# Patient Record
Sex: Male | Born: 1965 | Race: White | Hispanic: No | Marital: Married
Health system: Southern US, Community
[De-identification: ages and names within clinical notes are randomized; demographics above are authoritative.]

---

## 2022-01-07 ENCOUNTER — Emergency Department: Payer: Managed Care, Other (non HMO)

## 2022-01-07 ENCOUNTER — Encounter: Payer: Self-pay | Admitting: Emergency Medicine

## 2022-01-07 ENCOUNTER — Emergency Department
Admission: EM | Admit: 2022-01-07 | Discharge: 2022-01-07 | Disposition: A | Discharge status: Home / Self Care | Payer: Managed Care, Other (non HMO) | Attending: Emergency Medicine | Admitting: Emergency Medicine

## 2022-01-07 ENCOUNTER — Other Ambulatory Visit: Payer: Self-pay

## 2022-01-07 DIAGNOSIS — Z7984 Long term (current) use of oral hypoglycemic drugs: Secondary | ICD-10-CM | POA: Diagnosis not present

## 2022-01-07 DIAGNOSIS — E1165 Type 2 diabetes mellitus with hyperglycemia: Secondary | ICD-10-CM | POA: Diagnosis not present

## 2022-01-07 DIAGNOSIS — R42 Dizziness and giddiness: Secondary | ICD-10-CM | POA: Diagnosis not present

## 2022-01-07 DIAGNOSIS — R519 Headache, unspecified: Secondary | ICD-10-CM | POA: Diagnosis not present

## 2022-01-07 DIAGNOSIS — H547 Unspecified visual loss: Secondary | ICD-10-CM | POA: Diagnosis present

## 2022-01-07 DIAGNOSIS — H539 Unspecified visual disturbance: Secondary | ICD-10-CM

## 2022-01-07 DIAGNOSIS — H538 Other visual disturbances: Secondary | ICD-10-CM | POA: Diagnosis not present

## 2022-01-07 LAB — CBC
HCT: 50.5 % (ref 39.0–52.0)
Hemoglobin: 17.3 g/dL — ABNORMAL HIGH (ref 13.0–17.0)
MCH: 30.1 pg (ref 26.0–34.0)
MCHC: 34.3 g/dL (ref 30.0–36.0)
MCV: 88 fL (ref 80.0–100.0)
Platelets: 157 10*3/uL (ref 150–400)
RBC: 5.74 MIL/uL (ref 4.22–5.81)
RDW: 12.8 % (ref 11.5–15.5)
WBC: 7.6 10*3/uL (ref 4.0–10.5)
nRBC: 0 % (ref 0.0–0.2)

## 2022-01-07 LAB — URINALYSIS, ROUTINE W REFLEX MICROSCOPIC
Bilirubin Urine: NEGATIVE
Glucose, UA: NEGATIVE mg/dL
Hgb urine dipstick: NEGATIVE
Ketones, ur: NEGATIVE mg/dL
Leukocytes,Ua: NEGATIVE
Nitrite: NEGATIVE
Protein, ur: NEGATIVE mg/dL
Specific Gravity, Urine: 1.017 (ref 1.005–1.030)
pH: 6 (ref 5.0–8.0)

## 2022-01-07 LAB — BASIC METABOLIC PANEL
Anion gap: 11 (ref 5–15)
BUN: 17 mg/dL (ref 6–20)
CO2: 21 mmol/L — ABNORMAL LOW (ref 22–32)
Calcium: 9.3 mg/dL (ref 8.9–10.3)
Chloride: 105 mmol/L (ref 98–111)
Creatinine, Ser: 0.91 mg/dL (ref 0.61–1.24)
GFR, Estimated: >60 mL/min (ref >60)
Glucose, Bld: 160 mg/dL — ABNORMAL HIGH (ref 70–99)
Potassium: 4.5 mmol/L (ref 3.5–5.1)
Sodium: 137 mmol/L (ref 135–145)

## 2022-01-07 LAB — TROPONIN I (HIGH SENSITIVITY)
Troponin I (High Sensitivity): 4 ng/L (ref <18)
Troponin I (High Sensitivity): 3 ng/L (ref <18)

## 2022-01-07 LAB — CBG MONITORING, ED: Glucose-Capillary: 174 mg/dL — ABNORMAL HIGH (ref 70–99)

## 2022-01-07 IMAGING — MR MR MRA NECK WO/W CM
2 of 3 series · 26 of 48 positions shown · IV contrast (10ml Gadavist)
Comparison: None Available.

CLINICAL DATA: Transient ischemic attack, abnormal vision



[Series 12: angio_fl3d_cor_pre_ttc=3.0s · coronal · 0.9mm · 0.85mm/px · 13 of 80 slices shown]
[im 1/80]
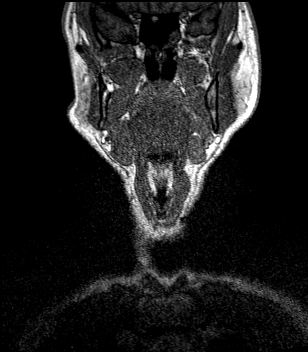
[im 7/80]
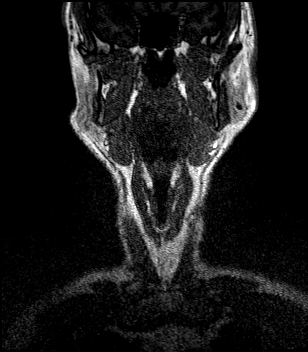
[im 14/80]
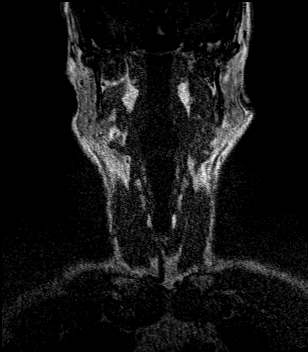
[im 20/80]
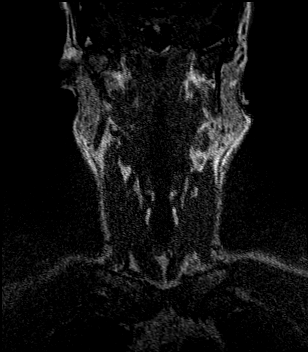
[im 27/80]
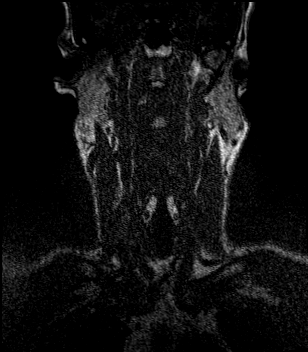
[im 33/80]
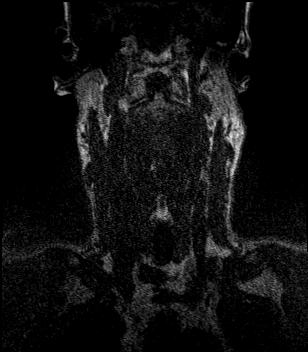
[im 40/80]
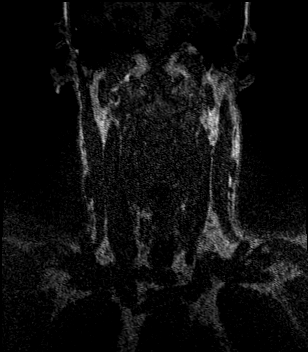
[im 47/80]
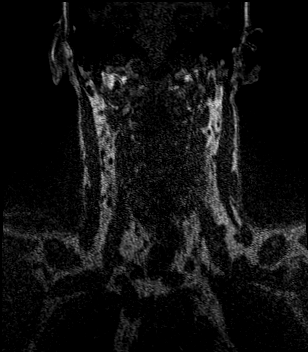
[im 53/80]
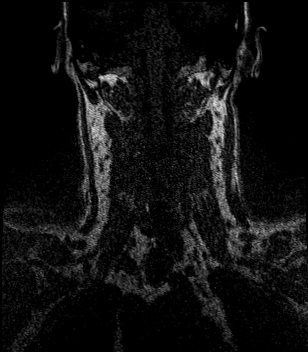
[im 60/80]
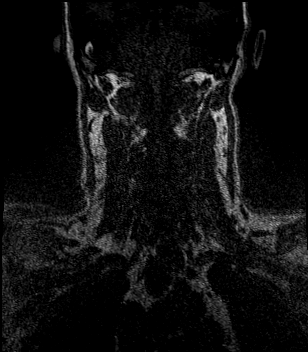
[im 66/80]
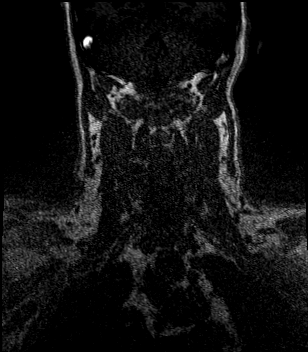
[im 73/80]
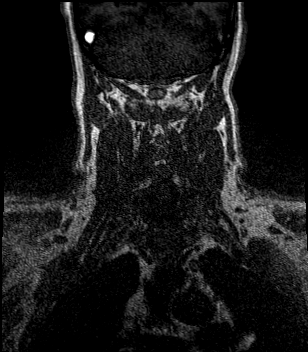
[im 80/80]
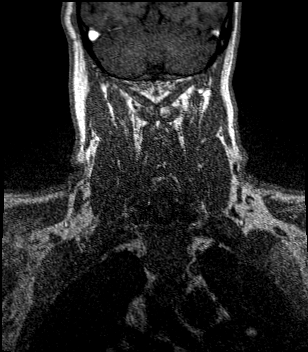

[Series 14: angio_fl3d_cor_post_ttc=3.0s · coronal · 0.9mm · 0.85mm/px · 13 of 80 slices shown]
[im 1/80]
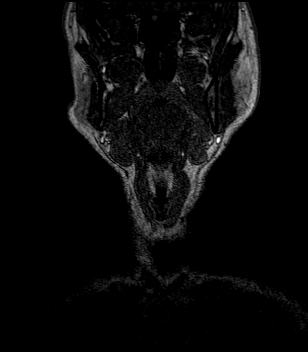
[im 7/80]
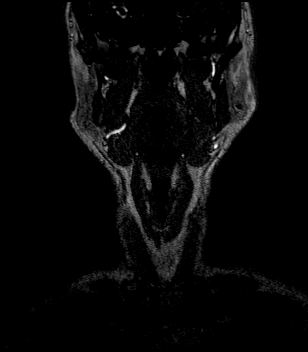
[im 14/80]
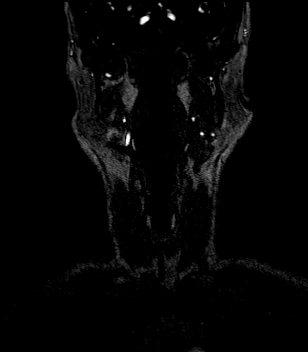
[im 20/80]
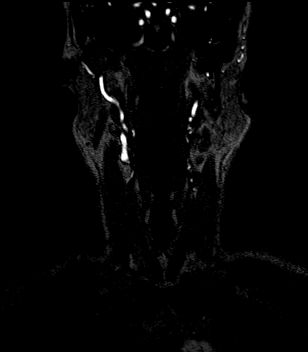
[im 27/80]
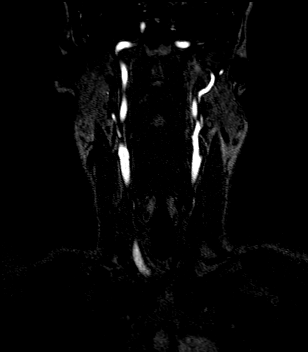
[im 33/80]
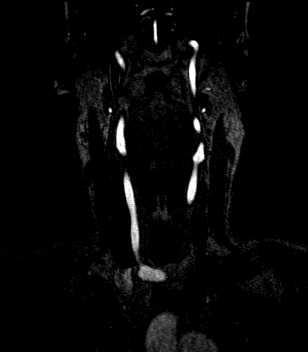
[im 40/80]
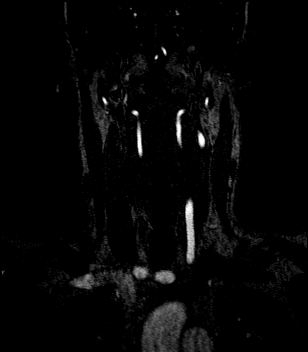
[im 47/80]
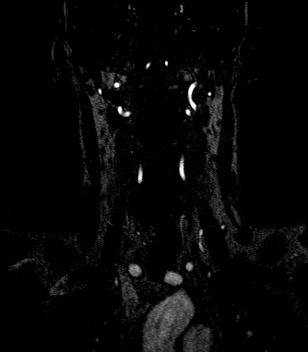
[im 53/80]
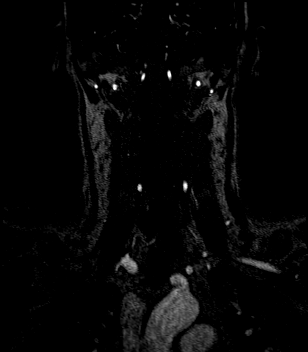
[im 60/80]
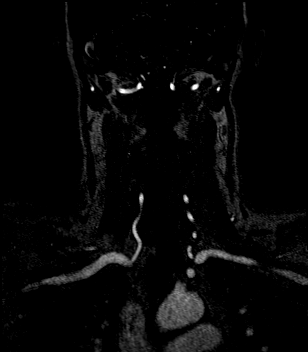
[im 66/80]
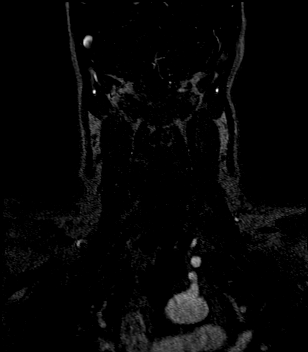
[im 73/80]
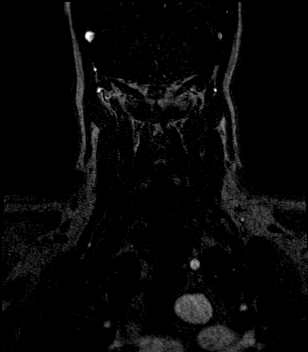
[im 80/80]
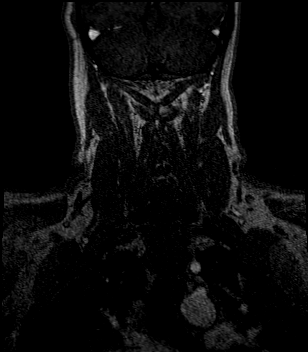

[26 of 48 positions shown; findings below may reference images not displayed]

FINDINGS: MRI HEAD

Brain: There is no acute infarction or intracranial hemorrhage.
There is no intracranial mass, mass effect, or edema. There is no
hydrocephalus or extra-axial fluid collection. Ventricles and sulci
are normal in size and configuration. Patchy foci of T2
hyperintensity in the supratentorial white matter are nonspecific
but may reflect mild chronic microvascular ischemic changes.

Vascular: Major vessel flow voids at the skull base are preserved.

Skull and upper cervical spine: Normal marrow signal is preserved.

Sinuses/Orbits: Small retention cysts or polyps of the sphenoid
sinuses. Orbits are unremarkable.

Other: Sella is unremarkable. Patchy mastoid fluid opacification
bilaterally.

MRA HEAD

Intracranial internal carotid arteries are patent with mild
atherosclerotic irregularity. Middle and anterior cerebral arteries
are patent. Intracranial vertebral arteries, basilar artery,
posterior cerebral arteries are patent. Right posterior
communicating artery is present. There is no significant stenosis or
aneurysm.

MRA NECK

Included arch is unremarkable. Included great vessel origins are
patent. Common, internal, and external carotid arteries are patent.
Mild atherosclerotic irregularity at the ICA origins. Codominant
extracranial vertebral arteries are patent. There is no
hemodynamically significant stenosis or evidence of dissection.
IMPRESSION: No acute infarction, hemorrhage, or mass. Mild chronic microvascular
ischemic changes.

No large vessel occlusion, hemodynamically significant stenosis, or
evidence of dissection.

## 2022-01-07 IMAGING — MR MR MRA HEAD W/O CM
1 series · 26 of 48 positions shown · IV contrast (gadavist)
Comparison: None Available.

CLINICAL DATA: Transient ischemic attack, abnormal vision



[Series 5: TOF · axial · 0.5mm · 0.48mm/px · z∈[-91,+6]mm · 26 of 217 slices shown]
[im 1/217]
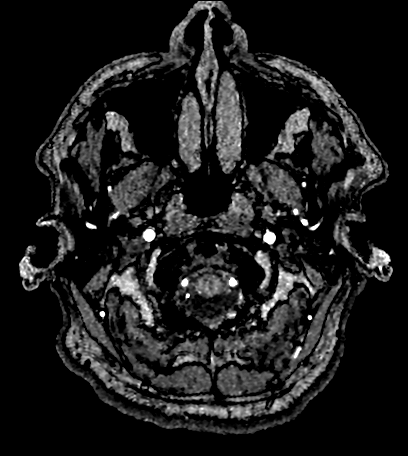
[im 5/217]
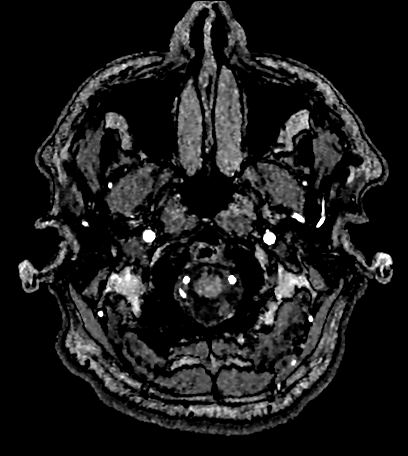
[im 10/217]
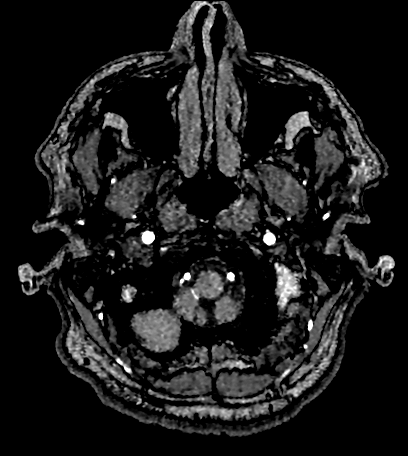
[im 14/217]
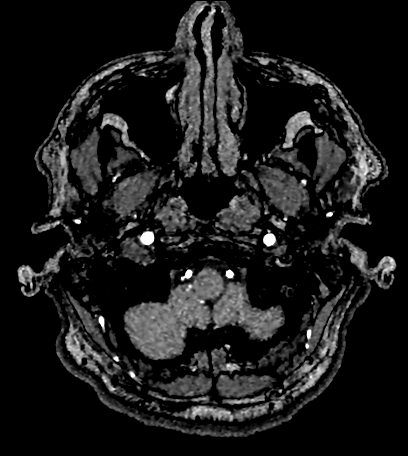
[im 19/217]
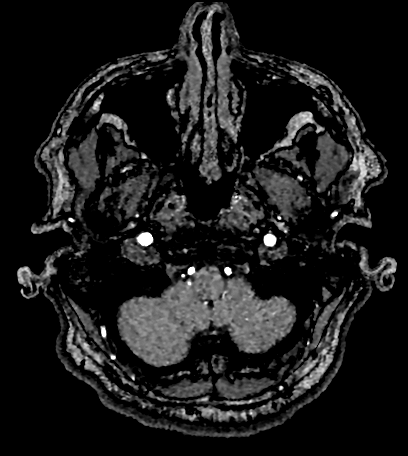
[im 23/217]
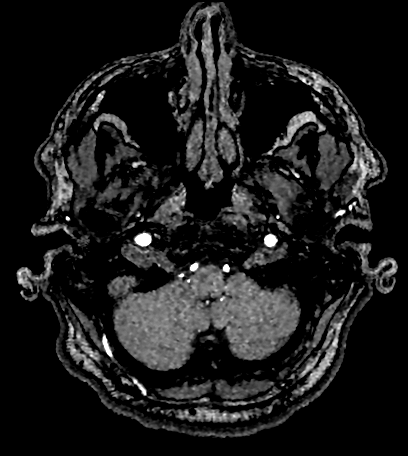
[im 28/217]
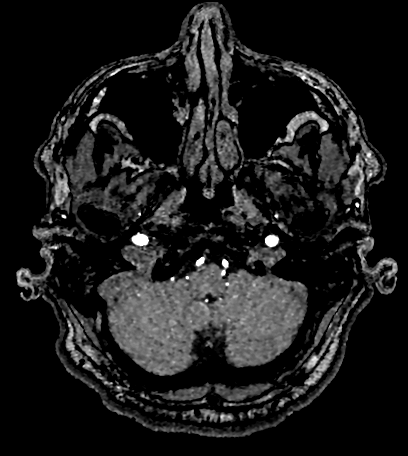
[im 33/217]
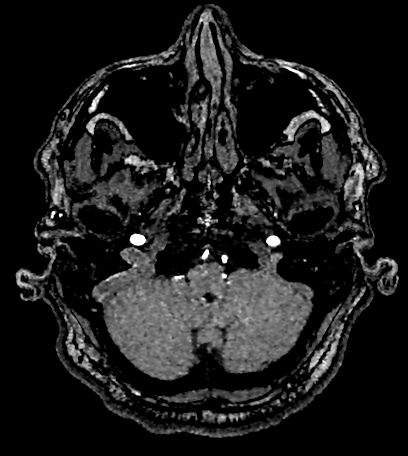
[im 37/217]
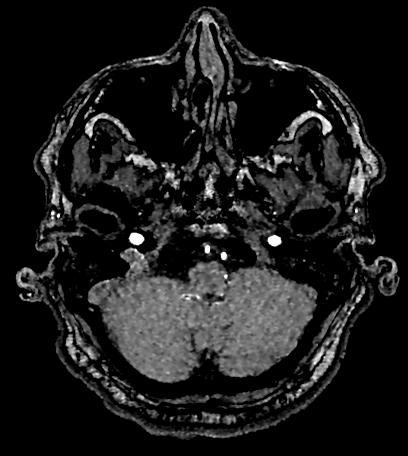
[im 42/217]
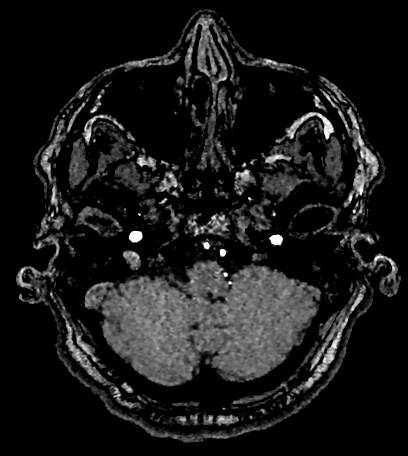
[im 46/217]
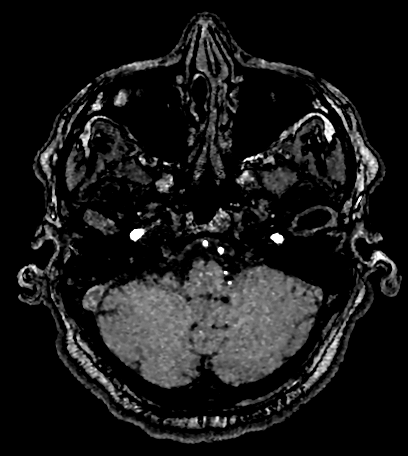
[im 51/217]
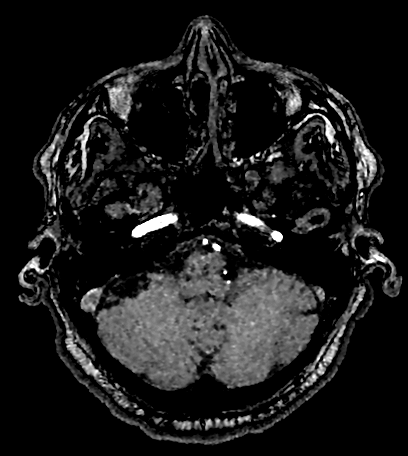
[im 56/217]
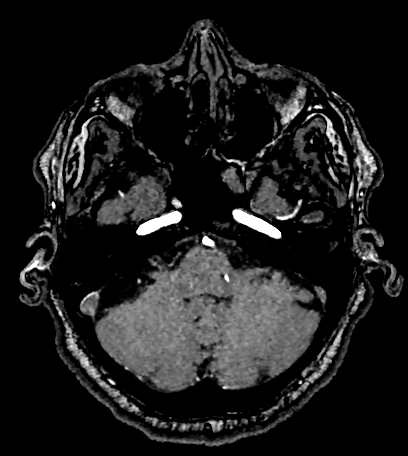
[im 60/217]
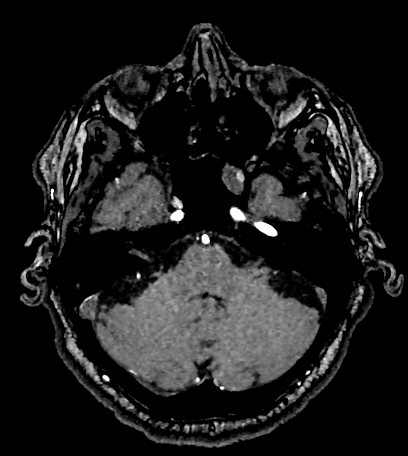
[im 65/217]
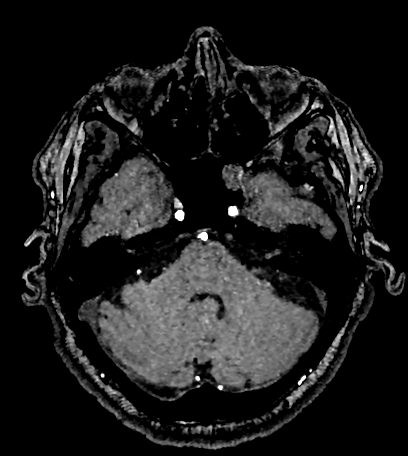
[im 69/217]
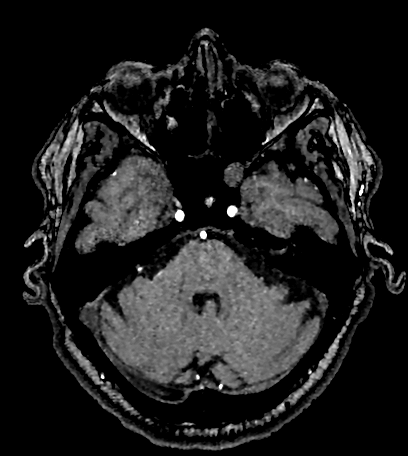
[im 74/217]
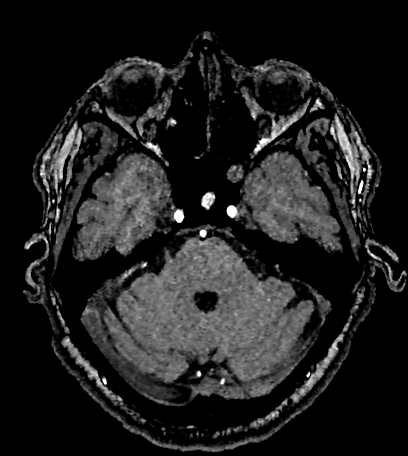
[im 79/217]
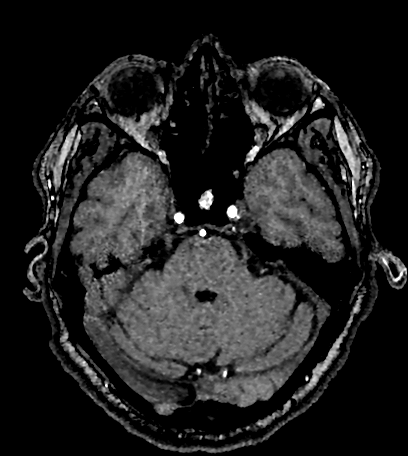
[im 83/217]
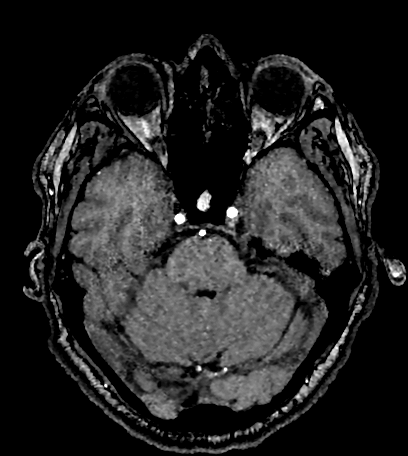
[im 97/217]
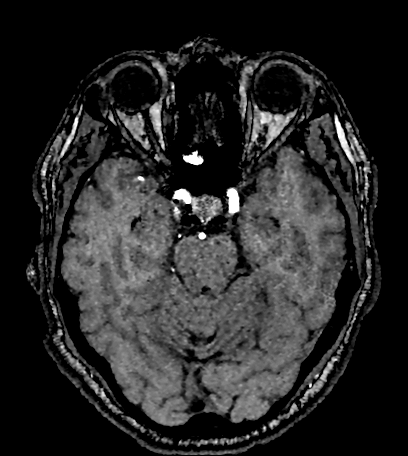
[im 111/217]
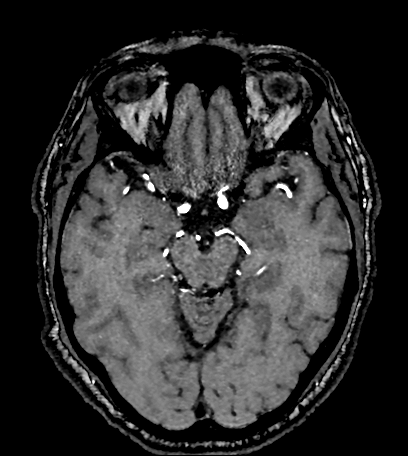
[im 125/217]
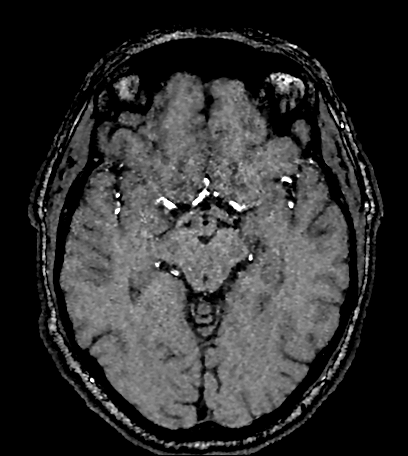
[im 152/217]
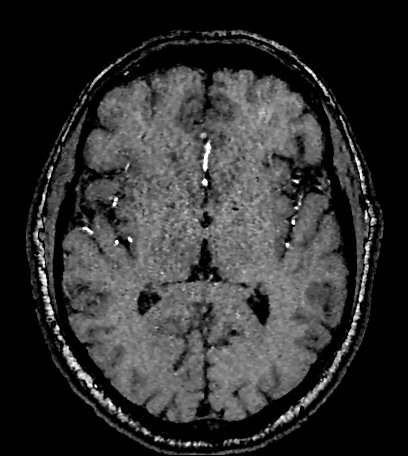
[im 180/217]
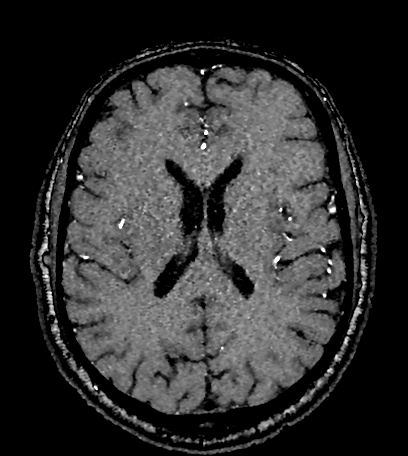
[im 184/217]
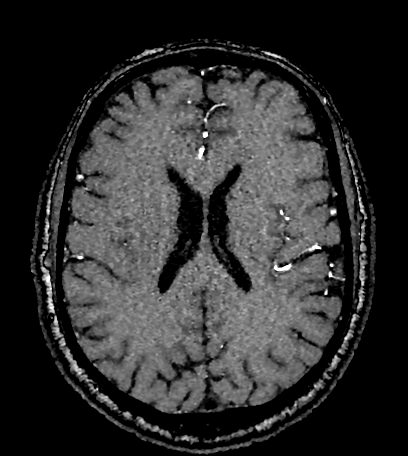
[im 207/217]
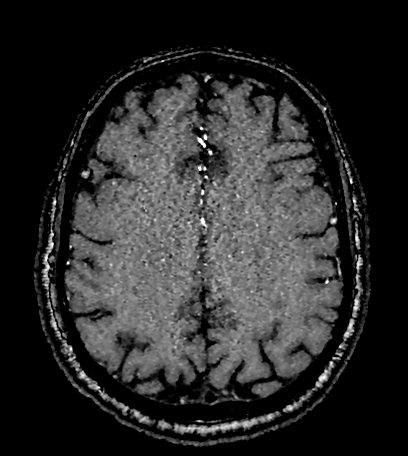

[26 of 48 positions shown; findings below may reference images not displayed]

FINDINGS: MRI HEAD

Brain: There is no acute infarction or intracranial hemorrhage.
There is no intracranial mass, mass effect, or edema. There is no
hydrocephalus or extra-axial fluid collection. Ventricles and sulci
are normal in size and configuration. Patchy foci of T2
hyperintensity in the supratentorial white matter are nonspecific
but may reflect mild chronic microvascular ischemic changes.

Vascular: Major vessel flow voids at the skull base are preserved.

Skull and upper cervical spine: Normal marrow signal is preserved.

Sinuses/Orbits: Small retention cysts or polyps of the sphenoid
sinuses. Orbits are unremarkable.

Other: Sella is unremarkable. Patchy mastoid fluid opacification
bilaterally.

MRA HEAD

Intracranial internal carotid arteries are patent with mild
atherosclerotic irregularity. Middle and anterior cerebral arteries
are patent. Intracranial vertebral arteries, basilar artery,
posterior cerebral arteries are patent. Right posterior
communicating artery is present. There is no significant stenosis or
aneurysm.

MRA NECK

Included arch is unremarkable. Included great vessel origins are
patent. Common, internal, and external carotid arteries are patent.
Mild atherosclerotic irregularity at the ICA origins. Codominant
extracranial vertebral arteries are patent. There is no
hemodynamically significant stenosis or evidence of dissection.
IMPRESSION: No acute infarction, hemorrhage, or mass. Mild chronic microvascular
ischemic changes.

No large vessel occlusion, hemodynamically significant stenosis, or
evidence of dissection.

## 2022-01-07 IMAGING — CT CT HEAD W/O CM
4 series · 17 of 47 positions shown, 19 images · non-contrast
Comparison: None Available.

CLINICAL DATA: Acute vision loss.



[Series 2: head wo · axial · 0.46mm/px · z∈[-82,+48]mm · 7 of 36 slices shown, 9 images]
[im 5/36  brain]
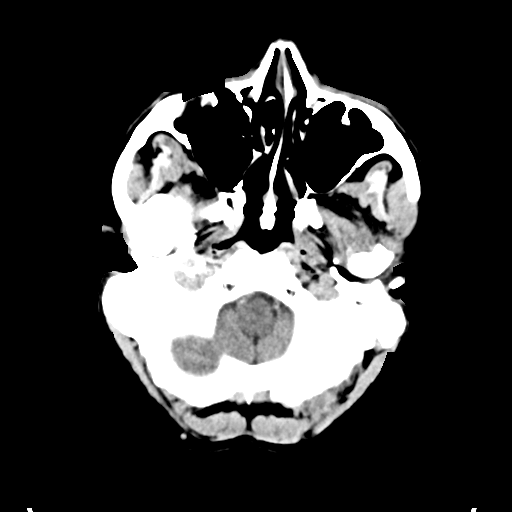
[im 5/36  bone]
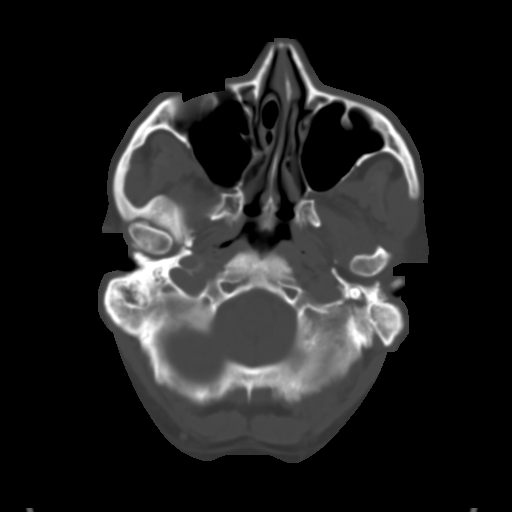
[im 9/36  brain]
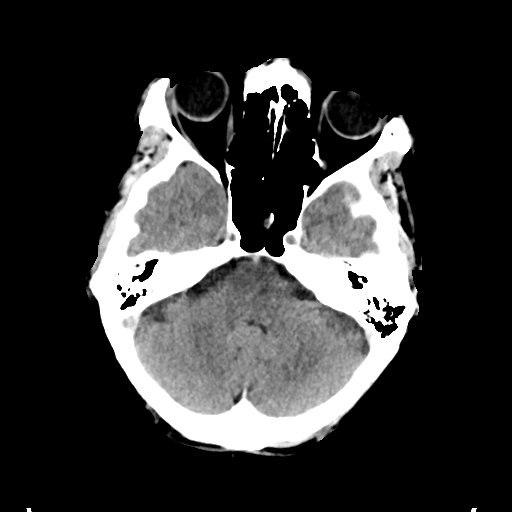
[im 14/36  brain]
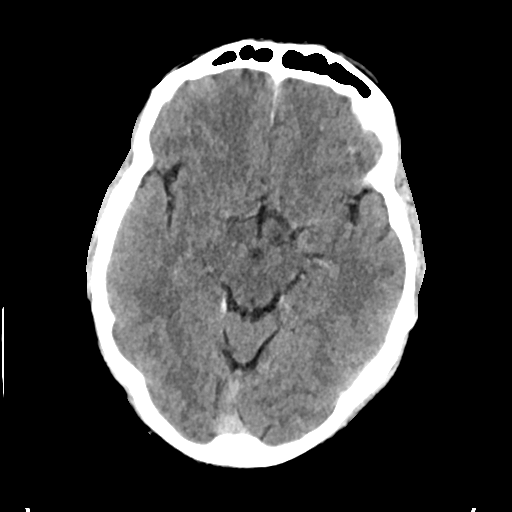
[im 18/36  brain]
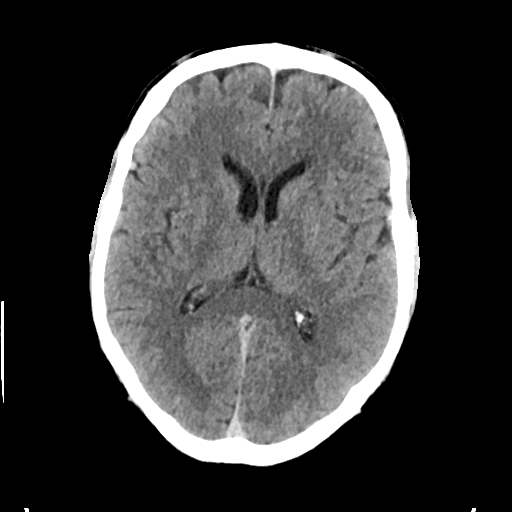
[im 22/36  brain]
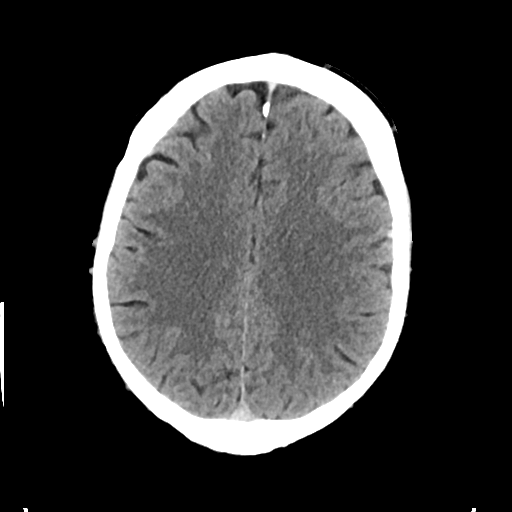
[im 22/36  bone]
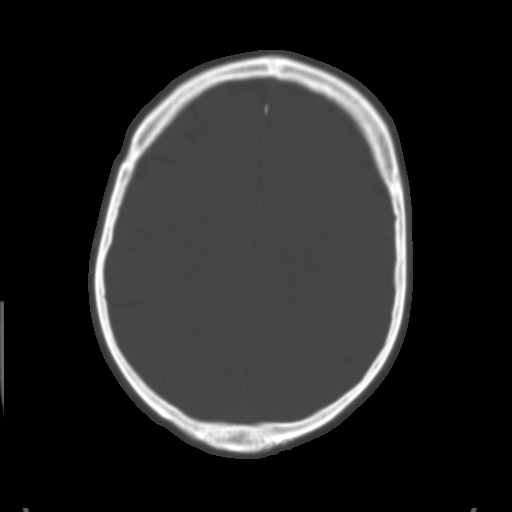
[im 27/36  brain]
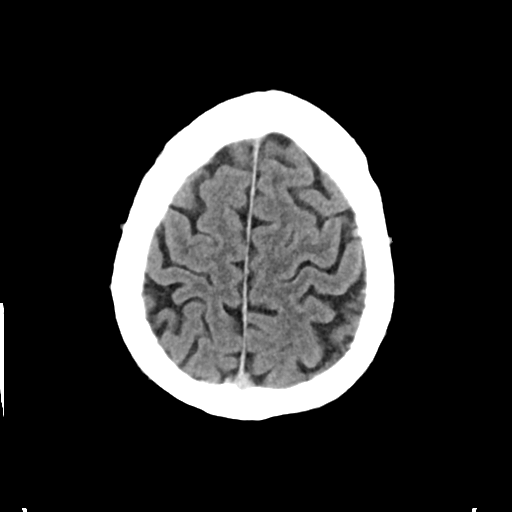
[im 31/36  brain]
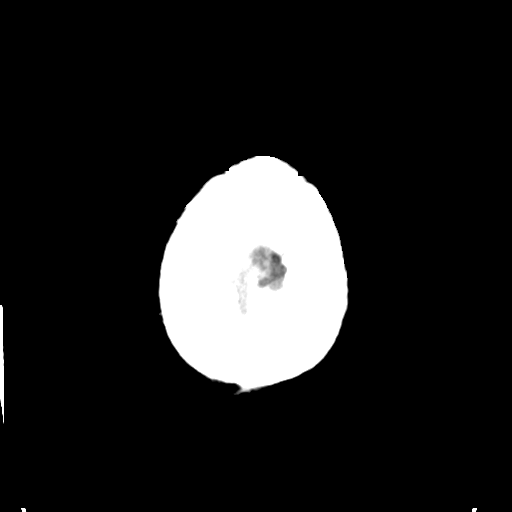

[Series 3: head bone · axial · 0.46mm/px · z∈[-86,-24]mm · 4 of 88 slices shown]
[im 9/88  bone]
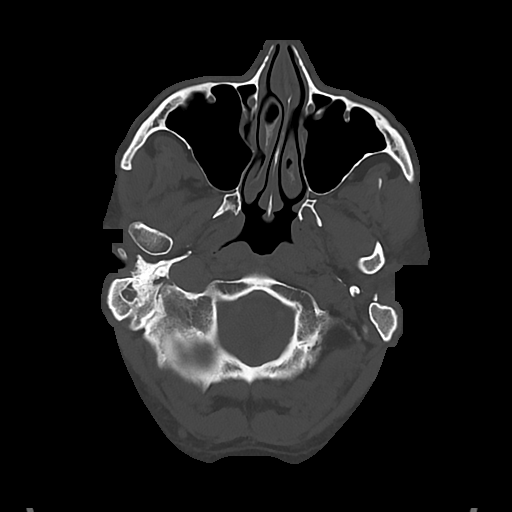
[im 18/88  bone]
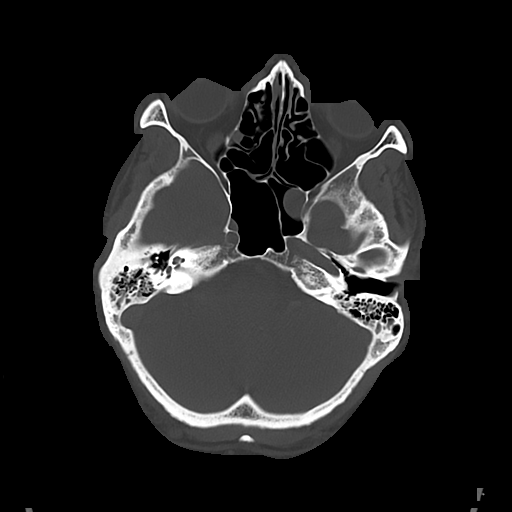
[im 27/88  bone]
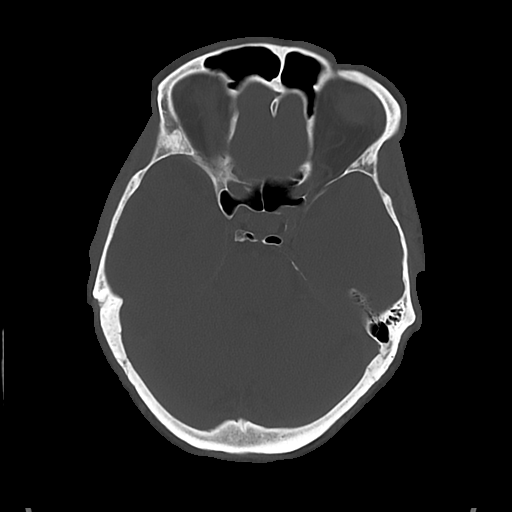
[im 40/88  bone]
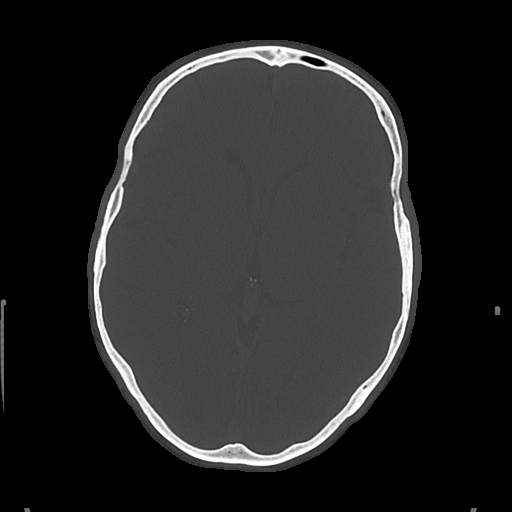

[Series 4: cor soft · coronal · 0.38mm/px · 3 of 74 slices shown]
[im 25/74  brain]
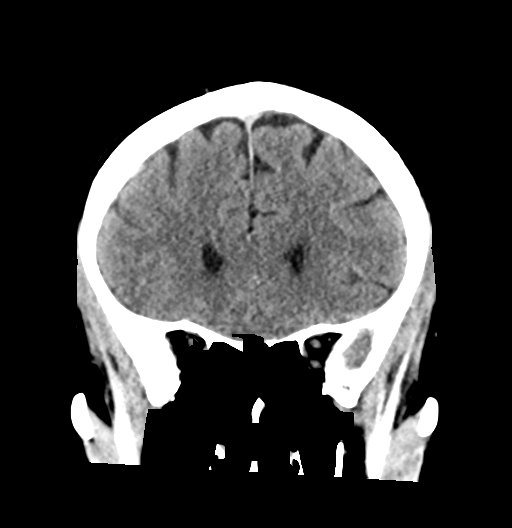
[im 33/74  brain]
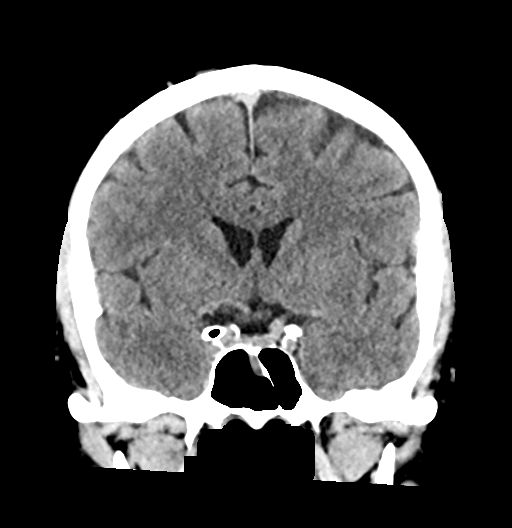
[im 41/74  brain]
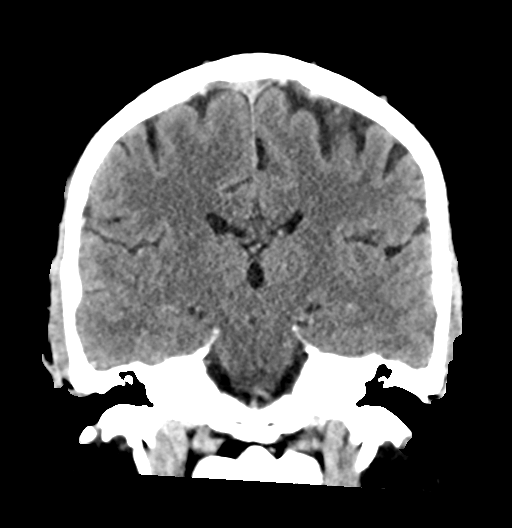

[Series 5: sag soft · sagittal · 0.37mm/px · 3 of 66 slices shown]
[im 22/66  brain]
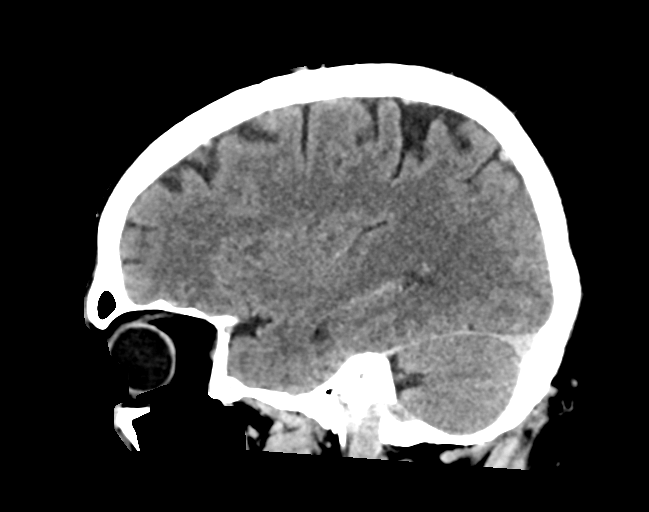
[im 33/66  brain]
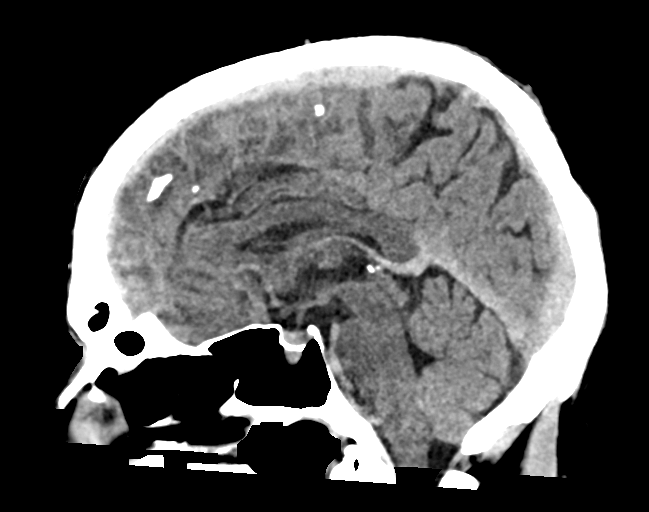
[im 44/66  brain]
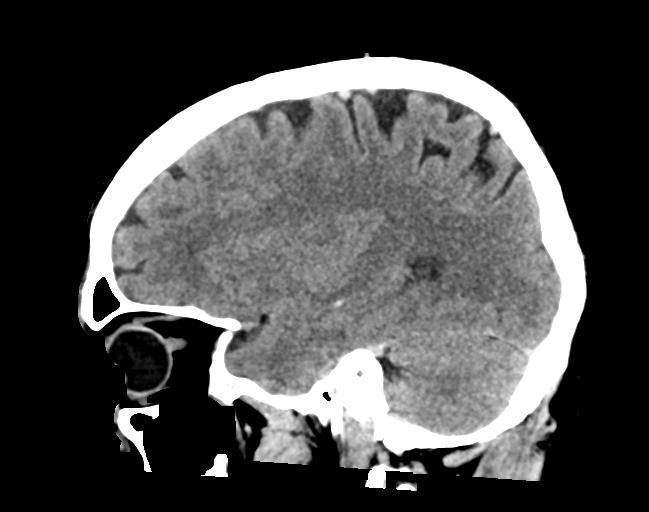

[17 of 47 positions shown; findings below may reference images not displayed]

FINDINGS: Brain: No evidence of intracranial hemorrhage, acute infarction,
hydrocephalus, extra-axial collection, or mass lesion/mass effect.

Vascular:  No hyperdense vessel or other acute findings.

Skull: No evidence of fracture or other significant bone
abnormality.

Sinuses/Orbits:  No acute findings.

Other: None.
IMPRESSION: Negative noncontrast head CT.

## 2022-01-07 IMAGING — MR MR HEAD W/O CM
12 series · 44 of 48 positions shown · IV contrast (gadavist)
Comparison: None Available.

CLINICAL DATA: Transient ischemic attack, abnormal vision



[Series 5: ax dwi_tracew · axial · 3.0mm · 0.65mm/px · z∈[-100,+55]mm · 2 of 48 slices shown]
[im 1/48]
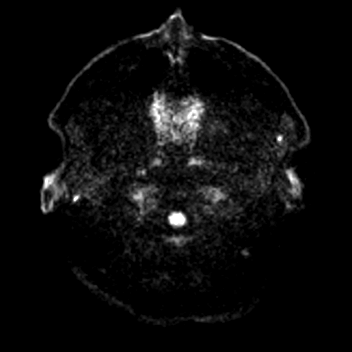
[im 48/48]
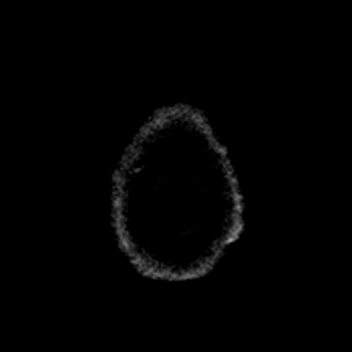

[Series 6: ax dwi_adc · axial · 3.0mm · 0.65mm/px · z∈[-100,+55]mm · 3 of 48 slices shown]
[im 1/48]
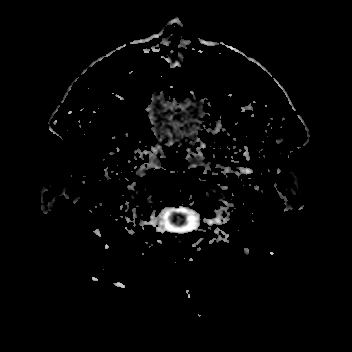
[im 24/48]
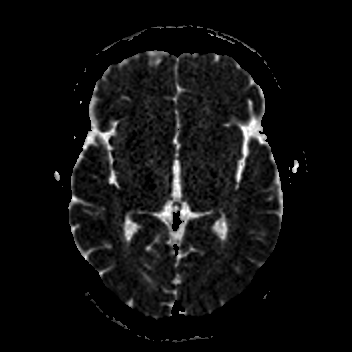
[im 48/48]
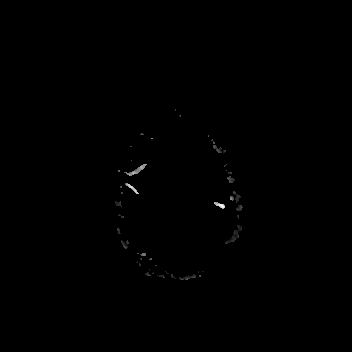

[Series 7: cor dwi_tracew · coronal · 5.0mm · 0.60mm/px · 3 of 38 slices shown]
[im 1/38]
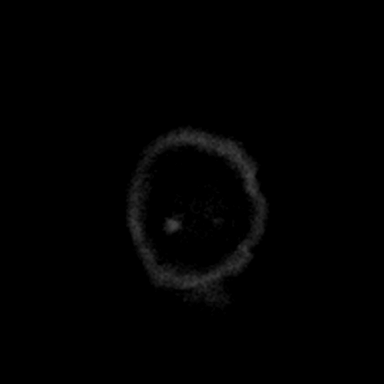
[im 19/38]
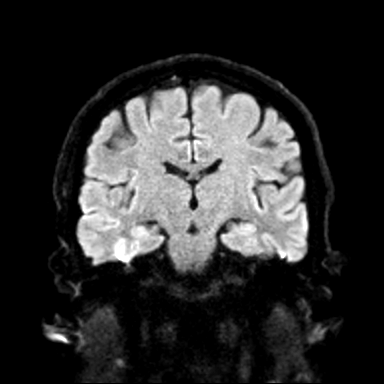
[im 38/38]
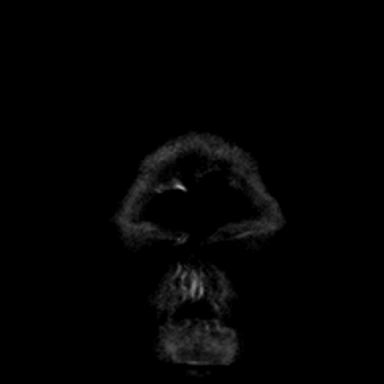

[Series 8: cor dwi_adc · coronal · 5.0mm · 0.60mm/px · 3 of 38 slices shown]
[im 1/38]
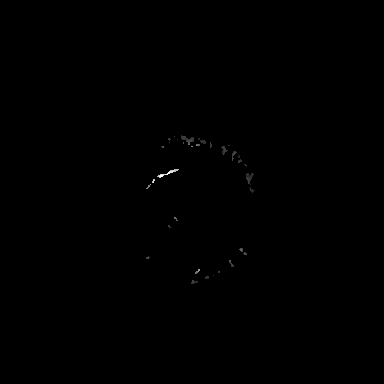
[im 19/38]
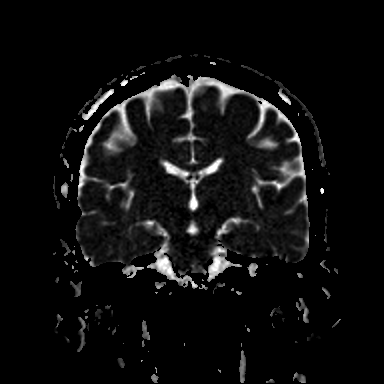
[im 38/38]
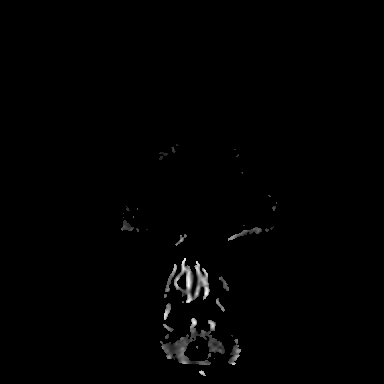

[Series 9: T1 · sagittal · 5.0mm · 0.62mm/px · 2 of 25 slices shown (1 of 2)]
[im 1/25]
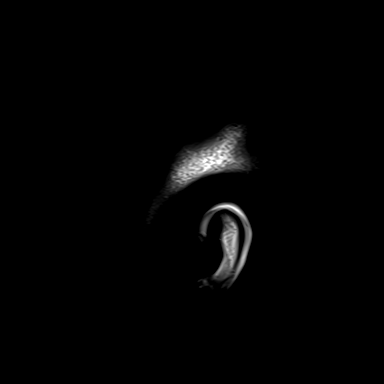
[im 25/25]
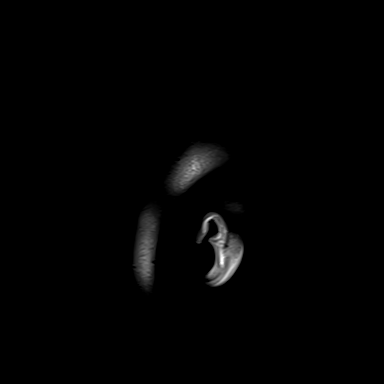

[Series 10: T2 · axial · 5.0mm · 0.53mm/px · z∈[-101,+55]mm · 2 of 27 slices shown (1 of 2)]
[im 1/27]
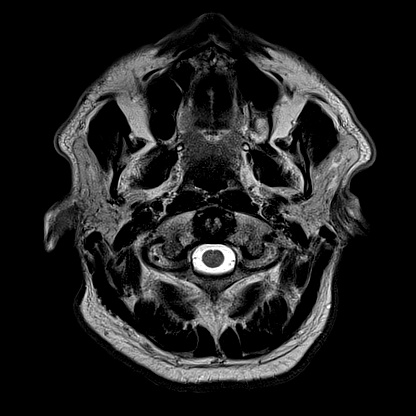
[im 27/27]
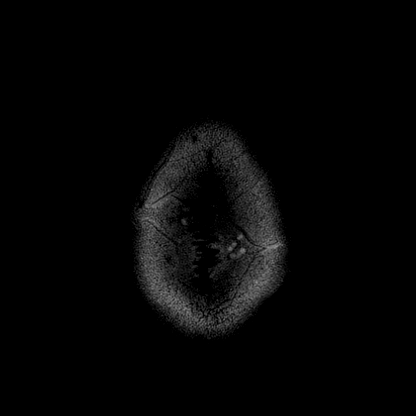

[Series 11: ax swi_mag · axial · 2.0mm · 0.90mm/px · z∈[-101,+56]mm · 5 of 80 slices shown]
[im 1/80]
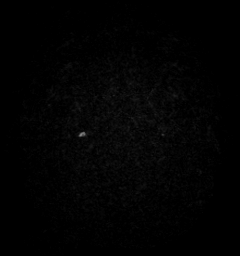
[im 20/80]
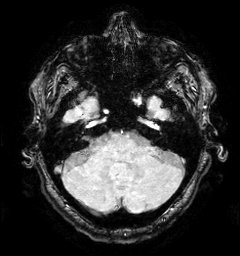
[im 40/80]
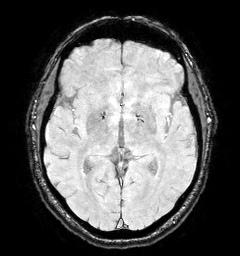
[im 60/80]
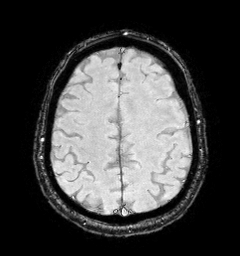
[im 80/80]
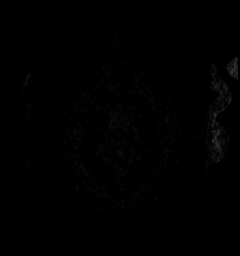

[Series 12: ax swi_pha · axial · 2.0mm · 0.90mm/px · z∈[-101,+56]mm · 5 of 80 slices shown]
[im 1/80]
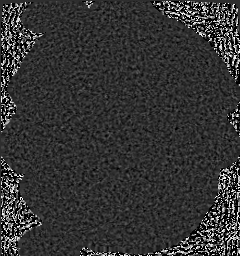
[im 20/80]
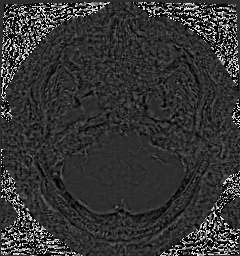
[im 40/80]
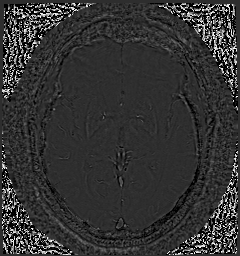
[im 60/80]
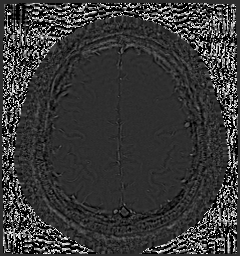
[im 80/80]
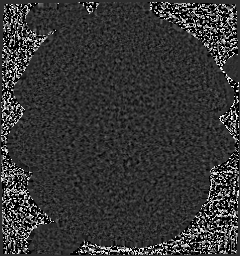

[Series 13: ax swi_swi · axial · 2.0mm · 0.90mm/px · z∈[-101,+56]mm · 5 of 80 slices shown]
[im 1/80]
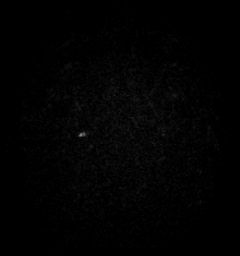
[im 20/80]
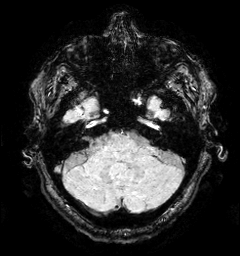
[im 40/80]
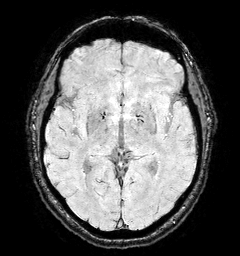
[im 60/80]
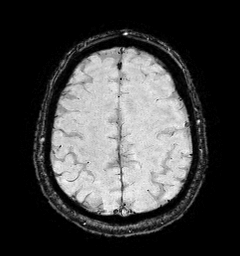
[im 80/80]
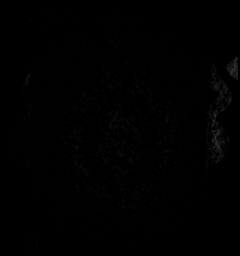

[Series 15: FLAIR · axial · 3.0mm · 0.53mm/px · z∈[-104,+58]mm · 4 of 55 slices shown]
[im 1/55]
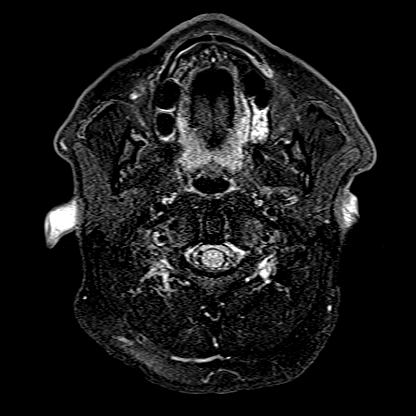
[im 19/55]
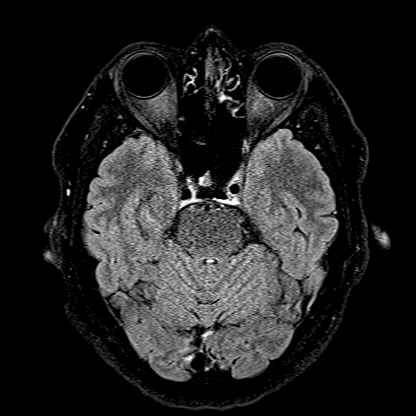
[im 37/55]
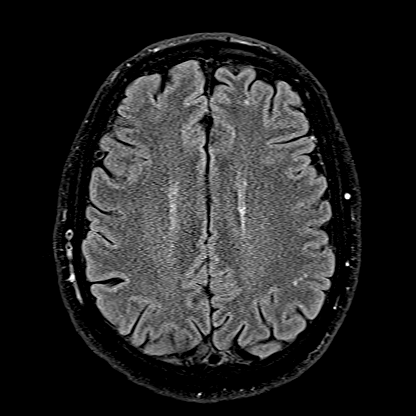
[im 55/55]
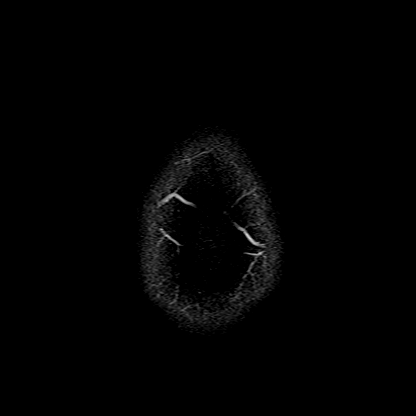

[Series 16: T1 · axial · 1.0mm · 0.98mm/px · z∈[-109,+65]mm · 8 of 176 slices shown (2 of 2)]
[im 1/176]
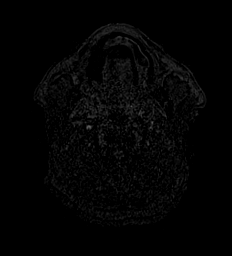
[im 32/176]
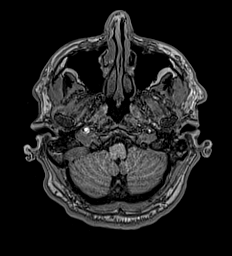
[im 48/176]
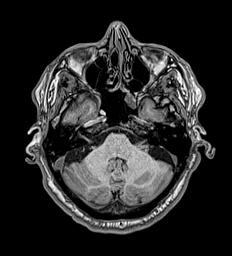
[im 80/176]
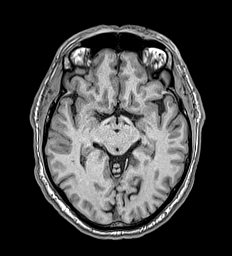
[im 96/176]
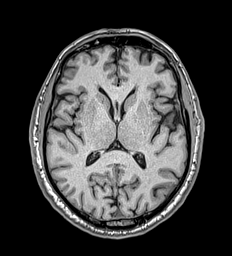
[im 128/176]
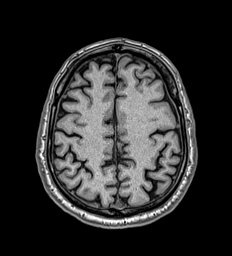
[im 144/176]
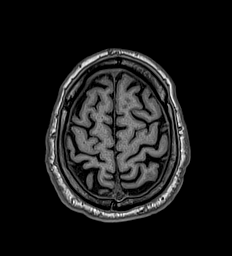
[im 176/176]
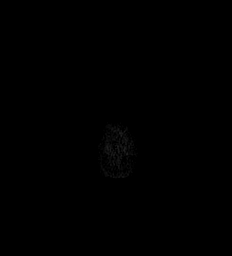

[Series 17: T2 · coronal · 5.0mm · 0.57mm/px · 2 of 29 slices shown (2 of 2)]
[im 1/29]
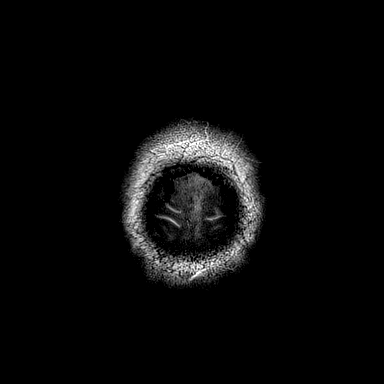
[im 29/29]
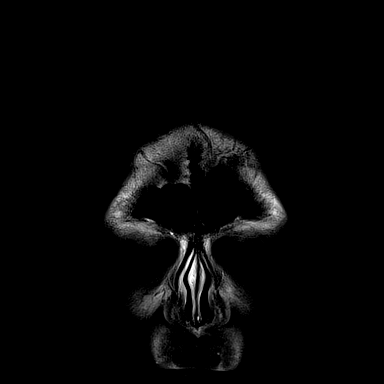

[44 of 48 positions shown; findings below may reference images not displayed]

FINDINGS: MRI HEAD

Brain: There is no acute infarction or intracranial hemorrhage.
There is no intracranial mass, mass effect, or edema. There is no
hydrocephalus or extra-axial fluid collection. Ventricles and sulci
are normal in size and configuration. Patchy foci of T2
hyperintensity in the supratentorial white matter are nonspecific
but may reflect mild chronic microvascular ischemic changes.

Vascular: Major vessel flow voids at the skull base are preserved.

Skull and upper cervical spine: Normal marrow signal is preserved.

Sinuses/Orbits: Small retention cysts or polyps of the sphenoid
sinuses. Orbits are unremarkable.

Other: Sella is unremarkable. Patchy mastoid fluid opacification
bilaterally.

MRA HEAD

Intracranial internal carotid arteries are patent with mild
atherosclerotic irregularity. Middle and anterior cerebral arteries
are patent. Intracranial vertebral arteries, basilar artery,
posterior cerebral arteries are patent. Right posterior
communicating artery is present. There is no significant stenosis or
aneurysm.

MRA NECK

Included arch is unremarkable. Included great vessel origins are
patent. Common, internal, and external carotid arteries are patent.
Mild atherosclerotic irregularity at the ICA origins. Codominant
extracranial vertebral arteries are patent. There is no
hemodynamically significant stenosis or evidence of dissection.
IMPRESSION: No acute infarction, hemorrhage, or mass. Mild chronic microvascular
ischemic changes.

No large vessel occlusion, hemodynamically significant stenosis, or
evidence of dissection.

## 2022-01-07 MED ORDER — METOCLOPRAMIDE HCL 5 MG/ML IJ SOLN
10.0000 mg | Freq: Once | INTRAMUSCULAR | Status: AC
  Administered 2022-01-07: 10 mg via INTRAVENOUS
  Filled 2022-01-07: qty 2

## 2022-01-07 MED ORDER — GADOBUTROL 1 MMOL/ML IV SOLN
10.0000 mL | Freq: Once | INTRAVENOUS | Status: AC | PRN
  Administered 2022-01-07: 10 mL via INTRAVENOUS

## 2022-01-07 MED ORDER — DIPHENHYDRAMINE HCL 50 MG/ML IJ SOLN
25.0000 mg | Freq: Once | INTRAMUSCULAR | Status: AC
  Administered 2022-01-07: 25 mg via INTRAVENOUS
  Filled 2022-01-07: qty 1

## 2022-01-07 NOTE — ED Provider Notes (Signed)
[facility] Provider Note    Event Date/Time   First MD Initiated Contact with Patient 01/07/22 1106     (approximate)   History   Loss of Vision   HPI  Mathew Ellis is a 56 y.o. male with a history of diabetes on metformin who presents with an episode of vision disturbance, acute onset several hours ago when he awoke.  The patient states that as he woke up, his vision was completely black in both eyes for about 30 seconds and then gradually started to see light and the vision came back.  He was lightheaded when this happened.  Since that time he reports slightly blurry vision bilaterally.  He has not had an episode like this before.  He denies any eye pain.  Subsequently after the episode he did develop a frontal headache which is also slightly worse on the right.  He denies any difficulty with speaking, weakness or numbness, difficulty walking or with balance.  The patient states that when he was a teenager he had a few episodes of his vision closing in and becoming very dizzy, but without passing out or any seizure-like activity.  This has not happened in more than 40 years.    Physical Exam   Triage Vital Signs: ED Triage Vitals  Enc Vitals Group     BP 01/07/22 1033 (!) 137/92     Pulse Rate 01/07/22 1033 77     Resp 01/07/22 1033 20     Temp 01/07/22 1033 98 F (36.7 C)     Temp Source 01/07/22 1033 Oral     SpO2 01/07/22 1033 95 %     Weight 01/07/22 1042 220 lb (99.8 kg)     Height 01/07/22 1042 6\' 1"  (1.854 m)     Head Circumference --      Peak Flow --      Pain Score 01/07/22 1035 8     Pain Loc --      Pain Edu? --      Excl. in GC? --     Most recent vital signs: Vitals:   01/07/22 1200 01/07/22 1500  BP: (!) 142/92 112/89  Pulse: 61 (!) 58  Resp:  16  Temp:    SpO2: 95% 98%     General: Alert and oriented, well-appearing. CV:  Good peripheral perfusion.  Resp:  Normal effort.  Abd:  No distention.  Other:  EOMI.  PERRLA.   No photophobia.  No facial droop.  Cranial nerves III through XII grossly intact.  Motor and sensory intact in all extremities.  No pronator drift.  Normal coordination with no ataxia on finger-to-nose.   ED Results / Procedures / Treatments   Labs (all labs ordered are listed, but only abnormal results are displayed) Labs Reviewed  BASIC METABOLIC PANEL - Abnormal; Notable for the following components:      Result Value   CO2 21 (*)    Glucose, Bld 160 (*)    All other components within normal limits  CBC - Abnormal; Notable for the following components:   Hemoglobin 17.3 (*)    All other components within normal limits  URINALYSIS, ROUTINE W REFLEX MICROSCOPIC - Abnormal; Notable for the following components:   Color, Urine YELLOW (*)    APPearance CLEAR (*)    All other components within normal limits  CBG MONITORING, ED - Abnormal; Notable for the following components:   Glucose-Capillary 174 (*)    All other components within  normal limits  CBG MONITORING, ED  TROPONIN I (HIGH SENSITIVITY)  TROPONIN I (HIGH SENSITIVITY)     EKG  ED ECG REPORT I, Dionne Bucy, the attending physician, personally viewed and interpreted this ECG.  Date: 01/07/2022 EKG Time: 1044 Rate: 69 Rhythm: normal sinus rhythm QRS Axis: normal Intervals: normal ST/T Wave abnormalities: normal Narrative Interpretation: no evidence of acute ischemia    RADIOLOGY  CT head: I independently viewed and interpreted the images; there is no ICH or evidence of acute stroke   PROCEDURES:  Critical Care performed: No  Procedures   MEDICATIONS ORDERED IN ED: Medications  metoCLOPramide (REGLAN) injection 10 mg (10 mg Intravenous Given 01/07/22 1216)  diphenhydrAMINE (BENADRYL) injection 25 mg (25 mg Intravenous Given 01/07/22 1216)  gadobutrol (GADAVIST) 1 MMOL/ML injection 10 mL (10 mLs Intravenous Contrast Given 01/07/22 1435)     IMPRESSION / MDM / ASSESSMENT AND PLAN / ED COURSE  I  reviewed the triage vital signs and the nursing notes.  56 year old male with PMH as noted above presents with an episode of bilateral vision loss and dizziness that lasted about 30 seconds, followed by mildly blurred vision and a frontal and right-sided headache.  On exam the patient is well-appearing.  His vital signs are normal.  Physical exam is unremarkable.  Thorough neurologic exam is normal.  I reviewed the past medical records, however the patient does not live in the area and has minimal prior records here.  He was seen at the [facility] emergency department for a work drug test in March and was seen at the ED at the [facility] in [place] in 2021 with COVID.  Differential diagnosis includes, but is not limited to, near syncope, seizure, complex migraine, or less likely but possible TIA.  CT head is negative.  BMP shows normal electrolytes.  Troponin is negative.  I consulted Dr. Amada Jupiter from neurology and discussed the case with him.  He recommends MRI of the brain and MRA of the head and neck to rule out vascular etiology or posterior circulation stroke.  If these are negative he advises that the patient will be appropriate for discharge with outpatient follow-up.  The patient is on the cardiac monitor to evaluate for evidence of arrhythmia and/or significant heart rate changes.  ----------------------------------------- 3:16 PM on 01/07/2022 -----------------------------------------  The patient's headache is resolved after Reglan and Benadryl.  He is asymptomatic at this time.  MRI read is pending.  I anticipate discharge home if it is negative for acute findings.  I signed the patient out to the oncoming ED physician Dr. Fuller Plan   FINAL CLINICAL IMPRESSION(S) / ED DIAGNOSES   Final diagnoses:  Vision disturbance     Rx / DC Orders   ED Discharge Orders     None        Note:  This document was prepared using Dragon voice recognition software and may  include unintentional dictation errors.    Dionne Bucy, MD 01/07/22 1517

## 2022-01-07 NOTE — ED Provider Notes (Signed)
3:56 PM Assumed care for off going team.   Blood pressure 112/89, pulse (!) 58, temperature 98 F (36.7 C), temperature source Oral, resp. rate 16, height 6\' 1"  (1.854 m), weight 99.8 kg, SpO2 98 %.  See their HPI for full report but in brief pending MRI   IMPRESSION:  No acute infarction, hemorrhage, or mass. Mild chronic microvascular  ischemic changes.     No large vessel occlusion, hemodynamically significant stenosis, or  evidence of dissection.       Reevaluated patient he feels good at this time.  Updated on results and feels comfortable with discharge home and follow-up with neurology outpatient    , MD 01/07/22 1557

## 2022-01-07 NOTE — ED Triage Notes (Signed)
Pt via POV from home. Pt states that this AM when he woke up, pt had a complete loss of vision that last 45 seconds states it was completely black in both eyes and headache. States that the last couple of days he having some increased fatigued and weakness. States he was recently started on Metformin for diabetes in August and it controlled his CBG well and states that he now feel likes the Meformin is not working because he is now feeling like he did before. Pt states the vision is now blurred Denies extremity weakness. States he does having some intermittent numbness in his fingertips. Pt is A&Ox4 and NAD

## 2022-01-07 NOTE — ED Notes (Signed)
Patient transported to MRI 

## 2022-01-07 NOTE — Discharge Instructions (Signed)
Follow-up with your primary care doctor when you return home.  Return here to the nearest ER immediately for new, worsening, or recurrent vision loss or change, change in consciousness or mental status, confusion, difficulty with speech, problems with balance or walking, numbness or weakness, any other strokelike symptoms, passing out, or any other new or worsening symptoms that concern you.
